# Patient Record
Sex: Male | Born: 1980 | Race: Black or African American | Hispanic: No | Marital: Single | Smoking: Current every day smoker
Health system: Southern US, Community
[De-identification: ages and names within clinical notes are randomized; demographics above are authoritative.]

---

## 2020-01-18 DIAGNOSIS — U071 COVID-19: Secondary | ICD-10-CM

## 2020-01-18 HISTORY — DX: COVID-19: U07.1

## 2020-08-03 ENCOUNTER — Encounter: Payer: Self-pay | Admitting: Emergency Medicine

## 2020-08-03 ENCOUNTER — Emergency Department
Admission: EM | Admit: 2020-08-03 | Discharge: 2020-08-04 | Disposition: A | Discharge status: Other Acute Inpatient Hospital | Payer: Self-pay | Attending: Emergency Medicine | Admitting: Emergency Medicine

## 2020-08-03 ENCOUNTER — Emergency Department: Payer: Self-pay

## 2020-08-03 DIAGNOSIS — Y92481 Parking lot as the place of occurrence of the external cause: Secondary | ICD-10-CM | POA: Insufficient documentation

## 2020-08-03 DIAGNOSIS — S0181XA Laceration without foreign body of other part of head, initial encounter: Secondary | ICD-10-CM | POA: Insufficient documentation

## 2020-08-03 DIAGNOSIS — S199XXA Unspecified injury of neck, initial encounter: Secondary | ICD-10-CM | POA: Insufficient documentation

## 2020-08-03 DIAGNOSIS — E860 Dehydration: Secondary | ICD-10-CM | POA: Insufficient documentation

## 2020-08-03 DIAGNOSIS — Z20822 Contact with and (suspected) exposure to covid-19: Secondary | ICD-10-CM | POA: Insufficient documentation

## 2020-08-03 DIAGNOSIS — Z8616 Personal history of COVID-19: Secondary | ICD-10-CM | POA: Insufficient documentation

## 2020-08-03 DIAGNOSIS — S02609B Fracture of mandible, unspecified, initial encounter for open fracture: Secondary | ICD-10-CM | POA: Insufficient documentation

## 2020-08-03 DIAGNOSIS — Z23 Encounter for immunization: Secondary | ICD-10-CM | POA: Insufficient documentation

## 2020-08-03 DIAGNOSIS — R55 Syncope and collapse: Secondary | ICD-10-CM | POA: Insufficient documentation

## 2020-08-03 DIAGNOSIS — R748 Abnormal levels of other serum enzymes: Secondary | ICD-10-CM | POA: Insufficient documentation

## 2020-08-03 DIAGNOSIS — F17200 Nicotine dependence, unspecified, uncomplicated: Secondary | ICD-10-CM | POA: Insufficient documentation

## 2020-08-03 DIAGNOSIS — N179 Acute kidney failure, unspecified: Secondary | ICD-10-CM | POA: Insufficient documentation

## 2020-08-03 DIAGNOSIS — S1980XA Other specified injuries of unspecified part of neck, initial encounter: Secondary | ICD-10-CM

## 2020-08-03 DIAGNOSIS — W19XXXA Unspecified fall, initial encounter: Secondary | ICD-10-CM | POA: Insufficient documentation

## 2020-08-03 LAB — CBC
HCT: 45.6 % (ref 39.0–52.0)
Hemoglobin: 15.6 g/dL (ref 13.0–17.0)
MCH: 32.2 pg (ref 26.0–34.0)
MCHC: 34.2 g/dL (ref 30.0–36.0)
MCV: 94 fL (ref 80.0–100.0)
Platelets: 275 10*3/uL (ref 150–400)
RBC: 4.85 MIL/uL (ref 4.22–5.81)
RDW: 11.6 % (ref 11.5–15.5)
WBC: 18.9 10*3/uL — ABNORMAL HIGH (ref 4.0–10.5)
nRBC: 0 % (ref 0.0–0.2)

## 2020-08-03 MED ORDER — LACTATED RINGERS IV BOLUS
1000.0000 mL | Freq: Once | INTRAVENOUS | Status: AC
  Administered 2020-08-04: 1000 mL via INTRAVENOUS

## 2020-08-03 MED ORDER — MORPHINE SULFATE (PF) 4 MG/ML IV SOLN
4.0000 mg | Freq: Once | INTRAVENOUS | Status: AC
  Administered 2020-08-04: 4 mg via INTRAVENOUS
  Filled 2020-08-03: qty 1

## 2020-08-03 MED ORDER — TETANUS-DIPHTH-ACELL PERTUSSIS 5-2.5-18.5 LF-MCG/0.5 IM SUSY
0.5000 mL | PREFILLED_SYRINGE | Freq: Once | INTRAMUSCULAR | Status: AC
  Administered 2020-08-04: 0.5 mL via INTRAMUSCULAR
  Filled 2020-08-03: qty 0.5

## 2020-08-03 MED ORDER — IOHEXOL 300 MG/ML  SOLN
75.0000 mL | Freq: Once | INTRAMUSCULAR | Status: AC | PRN
  Administered 2020-08-03: 75 mL via INTRAVENOUS

## 2020-08-03 MED ORDER — ONDANSETRON HCL 4 MG/2ML IJ SOLN
4.0000 mg | INTRAMUSCULAR | Status: AC
  Administered 2020-08-04: 4 mg via INTRAVENOUS
  Filled 2020-08-03: qty 2

## 2020-08-03 NOTE — ED Notes (Signed)
Pts spouse reports they were in car in route to eat when pt started to say, "Im tripping." Spouse reports pt then slumped down into seat and she reports having a hard time getting pt to come to. When at restaurant, pt got out and then fell face forward into the pavement. Spouse able to get pt up afterwards and bring straight to ED.   MD made aware.

## 2020-08-03 NOTE — ED Triage Notes (Signed)
Pt with spouse who reports pt had syncopal episode in parking lot and when she came around the car, pt was face down on pavement. Pt unknown of events. Pt has laceration and blood underneath chin. Blood noted to the nose as well as abrasion above upper lip. Pt not able to speak clearly and continues to state that he is having pain when swallowing. Pts spouse says that alcohol was consumed earlier in the evening. Pt able to answer questions but having difficulty with speaking clearly.

## 2020-08-03 NOTE — ED Provider Notes (Signed)
[facility] Emergency Department Provider Note  ____________________________________________   Event Date/Time   First MD Initiated Contact with Patient 08/03/20 2323     (approximate)  I have reviewed the triage vital signs and the nursing notes.   HISTORY  Chief Complaint Loss of Consciousness  Level 5 caveat: Patient's history is limited by his difficulty speaking due to pain and trismus after trauma.  HPI Robert Tate is a 40 y.o. male with no known chronic medical issues but he rarely goes to the doctor.  He presents along with his wife for evaluation of a syncopal episode with some resulting trauma to his face.  Reportedly he worked all day and had a little to eat or drink.  They were on their way to a restaurant at dinner when he was feeling strange.  He got out of the car and passed out, falling face forward and landing on his face on the pavement.  He returned to normal mental status essentially immediately afterwards and had no seizure-like activity witnessed by his wife.  He has a couple of facial/chin lacerations but he is having difficulty swallowing and speaking, cannot open his mouth more than a couple of centimeters, and reports pain throughout the front and middle of his face.  He denies pain anywhere else.  Specifically denies headache, neck pain, chest pain, shortness of breath, nausea, vomiting, and abdominal pain.  He says that the pain is worse when he tries to open his mouth and swallow and he is gurgling a little bit when he talks.  He has not in respiratory distress.  He says that he had a couple of shots of alcohol earlier in the evening prior to this event and he agreed that he did not have much food or fluids earlier in the day.  He denies drug use.  No new recent medications.  No history of syncopal episodes previously.  No history of heart failure.        Past Medical History:  Diagnosis Date  . COVID-19 01/18/2020   tested  positive on 01/18/2020    There are no problems to display for this patient.   History reviewed. No pertinent surgical history.  Prior to Admission medications   Not on File    Allergies Patient has no known allergies.  History reviewed. No pertinent family history.  Social History Social History   Tobacco Use  . Smoking status: Current Every Day Smoker  . Smokeless tobacco: Never Used  Substance Use Topics  . Alcohol use: Yes    Review of Systems Constitutional: No fever/chills Eyes: No visual changes. ENT: Facial trauma, trismus, difficulty swallowing. Cardiovascular: Positive for syncope and collapse.  Denies chest pain. Respiratory: Denies shortness of breath. Gastrointestinal: No abdominal pain.  No nausea, no vomiting.  No diarrhea.  No constipation. Genitourinary: Negative for dysuria. Musculoskeletal: Negative for neck pain.  Negative for back pain. Integumentary: Several facial lacerations. Neurological: Negative for headaches, focal weakness or numbness.   ____________________________________________   PHYSICAL EXAM:  VITAL SIGNS: ED Triage Vitals  Enc Vitals Group     BP 08/03/20 2312 127/74     Pulse Rate 08/03/20 2312 74     Resp 08/03/20 2312 18     Temp 08/03/20 2312 97.6 F (36.4 C)     Temp Source 08/03/20 2312 Axillary     SpO2 08/03/20 2312 100 %     Weight 08/03/20 2333 92.5 kg (204 lb)     Height 08/03/20  2333 1.829 m (6')     Head Circumference --      Peak Flow --      Pain Score --      Pain Loc --      Pain Edu? --      Excl. in GC? --     Constitutional: Alert and oriented.  Eyes: Conjunctivae are normal.  Head: Patient is holding his mouth a couple of centimeters open and the jaw seems slightly displaced to the left.  He cannot open his mouth well.  There is blood inside his mouth but no obvious active bleeding.  Tenderness to palpation of the bridge of the nose and slightly along the left side the jaw but no point  tenderness. Nose: Dried epistaxis, no active bleeding. Mouth/Throat: Patient is wearing a mask. Neck: No stridor.  No meningeal signs.  No expanding neck hematoma that is visible, no induration or tenderness in any 1 particular location along his neck or below his mandible. Cardiovascular: Normal rate, regular rhythm. Good peripheral circulation. Respiratory: Normal respiratory effort.  No retractions. Gastrointestinal: muscular body habitus.  Soft and nontender. No distention.  Musculoskeletal: No lower extremity tenderness nor edema. No gross deformities of extremities. Neurologic:  Normal speech and language. No gross focal neurologic deficits are appreciated.  Good strength throughout major muscle groups.  No focal weakness. Skin:  Skin is warm, dry and intact.  Deep jagged laceration proximately 2 cm wide at the middle of his chin, bleeding well controlled.  Irrigated by Engineer, civil (consulting). Psychiatric: Mood and affect are normal. Speech and behavior are normal.  ____________________________________________   LABS (all labs ordered are listed, but only abnormal results are displayed)  Labs Reviewed  BASIC METABOLIC PANEL - Abnormal; Notable for the following components:      Result Value   CO2 21 (*)    Glucose, Bld 102 (*)    Creatinine, Ser 1.45 (*)    All other components within normal limits  CBC - Abnormal; Notable for the following components:   WBC 18.9 (*)    All other components within normal limits  ETHANOL - Abnormal; Notable for the following components:   Alcohol, Ethyl (B) 56 (*)    All other components within normal limits  CK - Abnormal; Notable for the following components:   Total CK 824 (*)    All other components within normal limits  RESP PANEL BY RT-PCR (FLU A&B, COVID) ARPGX2  URINALYSIS, COMPLETE (UACMP) WITH MICROSCOPIC  URINE DRUG SCREEN, QUALITATIVE ([facility] ONLY)  TROPONIN I (HIGH SENSITIVITY)   ____________________________________________  EKG  ED ECG  REPORT I, Loleta Rose, the attending physician, personally viewed and interpreted this ECG.  Date: 08/02/2020 EKG Time: 23: 16 Rate: 70 Rhythm: normal sinus rhythm QRS Axis: normal Intervals: normal ST/T Wave abnormalities: normal Narrative Interpretation: no evidence of acute ischemia  ____________________________________________  RADIOLOGY I, Loleta Rose, personally viewed and evaluated these images (CT scans) as part of my medical decision making, as well as reviewing the written report by the radiologist.  ED MD interpretation: Extensive open mandibular fractures.  No acute abnormality noted in the soft tissue of the neck nor of the head and cervical spine.  Official radiology report(s): CT Head Wo Contrast  Result Date: 08/04/2020 CLINICAL DATA:  Initial evaluation for acute syncope, found down. EXAM: CT HEAD WITHOUT CONTRAST TECHNIQUE: Contiguous axial images were obtained from the base of the skull through the vertex without intravenous contrast. COMPARISON:  None. FINDINGS: Brain: Generalized age-related cerebral  atrophy. No acute intracranial hemorrhage. No acute large vessel territory infarct. No mass lesion, midline shift or mass effect. No hydrocephalus or extra-axial fluid collection. Vascular: No hyperdense vessel. Skull: Scalp soft tissues within normal limits.  Calvarium intact. Sinuses/Orbits: Globes and orbital soft tissues demonstrate no acute finding. Acute mandibular fractures partially visualize, better evaluated on concomitant maxillofacial CT. Visualized paranasal sinuses are clear. No mastoid effusion. Other: None. IMPRESSION: 1. No acute intracranial abnormality. 2. Acute mandibular fractures, better evaluated on concomitant maxillofacial CT. Electronically Signed   By: Rise Mu M.D.   On: 08/04/2020 01:10   CT Soft Tissue Neck W Contrast  Result Date: 08/04/2020 CLINICAL DATA:  Initial evaluation for soft tissue mass, spontaneous hemorrhage, syncopal  episode. EXAM: CT NECK WITH CONTRAST TECHNIQUE: Multidetector CT imaging of the neck was performed using the standard protocol following the bolus administration of intravenous contrast. CONTRAST:  73mL OMNIPAQUE IOHEXOL 300 MG/ML  SOLN COMPARISON:  Concomitant CTs of the face and cervical spine. FINDINGS: Pharynx and larynx: Oral cavity within normal limits without discrete mass or collection. Few scattered foci of soft tissue emphysema present within the left sublingual space related to the acute mandibular fractures. No frank soft tissue hematoma. No base of tongue lesion. Oropharynx and nasopharynx within normal limits. No retropharyngeal collection or swelling. Epiglottis normal. Vallecula clear. Remainder of the hypopharynx and supraglottic larynx within normal limits. True cords symmetric and normal. Subglottic airway patent and clear. Salivary glands: Salivary glands including the parotid and submandibular glands are normal. Thyroid: Normal. Lymph nodes: No enlarged or pathologic adenopathy within the neck. Vascular: Normal intravascular enhancement seen throughout the neck. Limited intracranial: Unremarkable. Visualized orbits: Unremarkable. Mastoids and visualized paranasal sinuses: Right maxillary sinus retention cyst present. Visualized paranasal sinuses are otherwise largely clear. Visualize mastoids and middle ear cavities are well pneumatized and free of fluid. Skeleton: Acute fractures of the mandible, better evaluated on concomitant maxillofacial CT. No other definite osseous abnormality, although cervical spine better evaluated on concomitant CT of the cervical spine. No discrete or worrisome osseous lesions. Upper chest: Visualized upper chest demonstrates no acute finding. Partially visualized lungs are grossly clear. Other: Soft tissue laceration overlies the acute left parasymphyseal mandibular fracture at the chin. Punctate radiopaque density at this level likely reflects a small retained  foreign body and/or debris (series 3, image 66). IMPRESSION: 1. Acute fractures of the mandible, better evaluated on concomitant maxillofacial CT. Overlying soft tissue laceration at the chin. Superimposed punctate radiopaque density at this level likely reflects a small retained foreign body and/or debris. No frank soft tissue hematoma. 2. No other acute abnormality within the neck. Electronically Signed   By: Rise Mu M.D.   On: 08/04/2020 00:31   CT C-SPINE NO CHARGE  Result Date: 08/04/2020 CLINICAL DATA:  Initial evaluation for acute trauma, syncope, found down. EXAM: CT CERVICAL SPINE WITHOUT CONTRAST TECHNIQUE: Multidetector CT imaging of the cervical spine was performed without intravenous contrast. Multiplanar CT image reconstructions were also generated. COMPARISON:  None. FINDINGS: Alignment: Straightening of the normal cervical lordosis. No listhesis or malalignment. Skull base and vertebrae: Skull base intact. Normal C1-2 articulations are preserved in the dens is intact. Vertebral body heights maintained. No acute fracture. Symmetric lucencies extending through the costochondral aspects of the first ribs without associated soft tissue swelling noted, favored to be chronic in nature (series 2, image 100). Soft tissues and spinal canal: Soft tissues of the neck demonstrate no acute finding. No abnormal prevertebral edema. Spinal canal within normal limits.  Acute mandibular fractures with overlying soft tissue laceration noted, better evaluated on maxillofacial CT. Disc levels: Mild cervical spondylosis noted at C5-6 and C6-7 without significant stenosis. Upper chest: Visualized upper chest demonstrates no acute finding. Partially visualized lung apices are clear. Other: None. IMPRESSION: 1. No acute traumatic injury within the cervical spine. 2. Acute mandibular fractures with overlying soft tissue laceration, better evaluated on maxillofacial CT. 3. Symmetric lucencies extending through  the costochondral aspects of the first ribs without associated soft tissue swelling, favored to be chronic in nature. Correlation with physical exam recommended. Electronically Signed   By: Rise Mu M.D.   On: 08/04/2020 01:02   CT Maxillofacial Wo Contrast  Result Date: 08/04/2020 CLINICAL DATA:  Initial evaluation for acute facial trauma, found down. EXAM: CT MAXILLOFACIAL WITHOUT CONTRAST TECHNIQUE: Multidetector CT imaging of the maxillofacial structures was performed. Multiplanar CT image reconstructions were also generated. COMPARISON:  None. FINDINGS: Osseous: Zygomatic arches intact. No acute maxillary fracture. Pterygoid plates intact. Deformity seen about the anterior nasal bones bilaterally, favored to be chronic, although an acute component is difficult to exclude. Right-to-left nasal septal deviation without visible fracture. There are acute nondisplaced fractures extending through the subcondylar bilateral mandibular rami, angulated on the right. Additional acute minimally displaced fracture extends through the parasymphyseal left mandibular body. Multiple left-sided teeth appear fractured, at least involving the left mandibular first bicuspid, second bicuspid, as well as the first and second molars (series 4, images 77, 75, 73, 70). The parasymphyseal fracture line extends through the root of the left mandibular central incisor. The right mandibular condyle is dislocated anteriorly relative to the temporomandibular joint (series 10, image 26). Orbits: Globes and orbital soft tissues within normal limits. Bony orbits intact. Sinuses: Mucosal thickening seen at the floor of the right maxillary sinus. Paranasal sinuses are otherwise clear. No hemosinus. Soft tissues: Soft tissue laceration present at the left chin overlying the parasymphyseal mandibular fracture. Small superimposed radiopaque retained foreign body and/or debris noted (series 3, image 93). Limited intracranial: Negative.  IMPRESSION: 1. Acute nondisplaced fractures extending through the subcondylar bilateral mandibular rami, angulated on the right, with additional acute mildly minimally fracture through the parasymphyseal left mandibular body. The right mandibular condyle is dislocated from the right TMJ. 2. Fracturing of multiple left-sided teeth, involving the left mandibular first and second bicuspid as well as the left first and second molars. 3. Deformity about the anterior nasal bones bilaterally, favored to be chronic, although correlation with physical exam for possible acute component recommended. 4. Soft tissue laceration at the left chin overlying the parasymphyseal mandibular fracture. Small superimposed radiopaque retained foreign body and/or debris as above. Electronically Signed   By: Rise Mu M.D.   On: 08/04/2020 00:41    ____________________________________________   PROCEDURES   Procedure(s) performed (including Critical Care):  .1-3 Lead EKG Interpretation Performed by: Loleta Rose, MD Authorized by: Loleta Rose, MD     Interpretation: normal     ECG rate:  68   ECG rate assessment: normal     Rhythm: sinus rhythm     Ectopy: none     Conduction: normal       ____________________________________________   INITIAL IMPRESSION / MDM / ASSESSMENT AND PLAN / ED COURSE  As part of my medical decision making, I reviewed the following data within the electronic MEDICAL RECORD NUMBER Nursing notes reviewed and incorporated, discussed with patient and family member, Labs reviewed , EKG interpreted , Old chart reviewed, Discussed with accepting physician (Dr. Leotis Shames  [place] in [facility] ED), Discussed with OMFS (Dr. Reuel Boom at [facility]) and reviewed Notes from prior ED visits   Differential diagnosis includes, but is not limited to, orthostatic hypotension, vasovagal episode, intoxication or medication/drug side effect, cardiac arrhythmia, seizure, CVA, less likely acute infection.  The  patient is on the cardiac monitor to evaluate for evidence of arrhythmia and/or significant heart rate changes.  Patient is neurologically intact, no altered mental status, GCS 15.  I suspect orthostatic hypotension in the setting of volume depletion from working hard today not eating or drinking very much, likely complicated by alcohol consumption earlier this evening.  I am most concerned about the patient's facial and/or neck trauma.  His physical exam suggest mandibular fracture or dislocation even though he does not have point tenderness.  Additionally he is reporting difficulty and pain with swallowing.  He has no obvious expanding neck hematoma and his airway is not compromised at this time but I am concerned about the possibility of a hematoma, bleed, fracture, etc.  I will proceed without labs for CT head without contrast given his syncopal episode and trauma, CT maxillofacial without contrast for the facial trauma, and CT soft tissue neck with IV contrast for trauma as well as the possibility of soft tissue swelling or bleeding, also with cervical spine reconstructions.  Lab work is pending which includes basic labs as well as a CK to look for evidence of rhabdomyolysis and an ethanol level to determine the degree of intoxication had a urine drug screen to try to identify other reasons for his syncopal episode.  I ordered morphine 4 mg IV and Zofran 4 mg IV in addition to 1 L lactated Ringer's for his probable volume depletion.  Also ordered a Tdap since the patient does not know when his last booster shot was and it was definitely more than 5 years according to the wife.     Clinical Course as of 08/04/20 0257  Sat Aug 03, 2020  2339 Informed CT technologist Minerva Areola that I wish to proceed emergently with scans without labs.  He acknowledged the plan [CF]  2356 WBC(!): 18.9 Nonspecific leukocytosis, likely reactive in the setting of his acute episode given no infectious signs or symptoms. [CF]   2358 Basic metabolic panel(!) Creatinine of 1.45 may indicate viral depletion and acute kidney injury but could be a result of his muscular body habitus.  Regardless he is getting IV fluids. [CF]  Sun Aug 04, 2020  0008 Alcohol, Ethyl (B)(!): 56 [CF]  0016 Basic metabolic panel(!) [CF]  0017 I personally reviewed the CT scans and it appears the patient has at least 3 separate mandibular fractures, 2 of which are significantly displaced.  I will treat with empiric antibiotics (Unasyn 3 g IV for broad-spectrum coverage including oral flora) while awaiting the reports from the radiologist, but given the blood in the patient's mouth and the extent of his mandibular fractures I believe these should be treated as open fractures. [CF]  0042 CK Total(!): 824 Elevated CK, will we will continue with IV fluids. [CF]  1856 CT Maxillofacial Wo Contrast Radiologist confirmed extensive mandibular and dental injuries including angulation and dislocation of the right mandibular condyle from the TMJ.  I updated the patient and his wife.  Patient is hemodynamically stable, getting IV fluids, some pain improvement but not substantial after the initial dose of morphine and I will provide an additional dose of morphine for milligrams IV, I am somewhat reluctant to over sedate with Dilaudid, for  example, because the patient is having to suction himself given his difficulty moving his jaw and swallowing, and I do not want to cause an airway or respiratory issue.  I talked with the patient and wife about the need to transfer to a center with the ability to handle facial trauma and their preference is for [facility].  I have asked my secretary Melody to contact the [facility] logistics center.  [CF]  0110 Troponin I (High Sensitivity): 3 High-sensitivity troponin is negative and the patient has not had any chest pain.  I am canceling the repeat. [CF]  0110 I spoke by phone with the [facility] logistics center and explained the clinical  situation.  We are awaiting callback from the facial trauma representative. [CF]  0117 CT Head Wo Contrast Radiology reports confirm my suspicion that there is no evidence of intracranial injury or bleeding and no cervical spine injury, nor any soft tissue neck injury.  The injury seem to be isolated to the patient's mandible. [CF]  4967 Spoke by phone with Dr. Reuel Boom with OMFS.  She agrees that the patient needs to be seen at [facility] and asks that we discuss with the ED for transfer.  Awaiting callback from EDP at [facility]. [CF]  0212 Discussed case by phone with Dr. Leatrice Jewels in the [facility] ED.  She accepted the patient as an ED to ED transfer.  Patient hemodynamically stable. [CF]  0231 SARS Coronavirus 2 by RT PCR: NEGATIVE [CF]    Clinical Course User Index [CF] Loleta Rose, MD     ____________________________________________  FINAL CLINICAL IMPRESSION(S) / ED DIAGNOSES  Final diagnoses:  Blunt trauma of neck  Syncope and collapse  Open fracture of mandible, unspecified laterality, unspecified mandibular site, initial encounter (HCC)  AKI (acute kidney injury) (HCC)  Dehydration  Elevated CK     MEDICATIONS GIVEN DURING THIS VISIT:  Medications  iohexol (OMNIPAQUE) 300 MG/ML solution 75 mL (75 mLs Intravenous Contrast Given 08/03/20 2348)  lactated ringers bolus 1,000 mL (0 mLs Intravenous Stopped 08/04/20 0231)  morphine 4 MG/ML injection 4 mg (4 mg Intravenous Given 08/04/20 0018)  ondansetron (ZOFRAN) injection 4 mg (4 mg Intravenous Given 08/04/20 0017)  Tdap (BOOSTRIX) injection 0.5 mL (0.5 mLs Intramuscular Given 08/04/20 0019)  Ampicillin-Sulbactam (UNASYN) 3 g in sodium chloride 0.9 % 100 mL IVPB (0 g Intravenous Stopped 08/04/20 0119)  lactated ringers bolus 1,000 mL (1,000 mLs Intravenous Transfusing/Transfer 08/04/20 0245)  morphine 4 MG/ML injection 4 mg (4 mg Intravenous Given 08/04/20 0135)     ED Discharge Orders    None      *Please note:  Dayvin Aber was evaluated in  Emergency Department on 08/04/2020 for the symptoms described in the history of present illness. He was evaluated in the context of the global COVID-19 pandemic, which necessitated consideration that the patient might be at risk for infection with the SARS-CoV-2 virus that causes COVID-19. Institutional protocols and algorithms that pertain to the evaluation of patients at risk for COVID-19 are in a state of rapid change based on information released by regulatory bodies including the CDC and federal and state organizations. These policies and algorithms were followed during the patient's care in the ED.  Some ED evaluations and interventions may be delayed as a result of limited staffing during and after the pandemic.*  Note:  This document was prepared using Dragon voice recognition software and may include unintentional dictation errors.   Loleta Rose, MD 08/04/20 [phone]

## 2020-08-04 LAB — CK: Total CK: 824 U/L — ABNORMAL HIGH (ref 49–397)

## 2020-08-04 LAB — BASIC METABOLIC PANEL
Anion gap: 13 (ref 5–15)
BUN: 14 mg/dL (ref 6–20)
CO2: 21 mmol/L — ABNORMAL LOW (ref 22–32)
Calcium: 9.2 mg/dL (ref 8.9–10.3)
Chloride: 105 mmol/L (ref 98–111)
Creatinine, Ser: 1.45 mg/dL — ABNORMAL HIGH (ref 0.61–1.24)
GFR, Estimated: >60 mL/min (ref >60)
Glucose, Bld: 102 mg/dL — ABNORMAL HIGH (ref 70–99)
Potassium: 4.1 mmol/L (ref 3.5–5.1)
Sodium: 139 mmol/L (ref 135–145)

## 2020-08-04 LAB — RESP PANEL BY RT-PCR (FLU A&B, COVID) ARPGX2
Influenza A by PCR: NEGATIVE
Influenza B by PCR: NEGATIVE
SARS Coronavirus 2 by RT PCR: NEGATIVE

## 2020-08-04 LAB — TROPONIN I (HIGH SENSITIVITY): Troponin I (High Sensitivity): 3 ng/L (ref <18)

## 2020-08-04 LAB — ETHANOL: Alcohol, Ethyl (B): 56 mg/dL — ABNORMAL HIGH (ref <10)

## 2020-08-04 MED ORDER — MORPHINE SULFATE (PF) 4 MG/ML IV SOLN
4.0000 mg | Freq: Once | INTRAVENOUS | Status: AC
Start: 2020-08-04 — End: 2020-08-04
  Administered 2020-08-04: 4 mg via INTRAVENOUS
  Filled 2020-08-04: qty 1

## 2020-08-04 MED ORDER — LACTATED RINGERS IV BOLUS
1000.0000 mL | Freq: Once | INTRAVENOUS | Status: AC
  Administered 2020-08-04: 1000 mL via INTRAVENOUS

## 2020-08-04 MED ORDER — SODIUM CHLORIDE 0.9 % IV SOLN
3.0000 g | INTRAVENOUS | Status: AC
  Administered 2020-08-04: 3 g via INTRAVENOUS
  Filled 2020-08-04: qty 8

## 2020-08-04 NOTE — ED Notes (Signed)
Tom to [employer] to [facility]. FaceSheet faxed.

## 2020-08-04 NOTE — ED Notes (Signed)
ED Provider at bedside. 

## 2020-08-04 NOTE — ED Notes (Signed)
Chart checked for completion 

## 2022-09-12 IMAGING — CT CT NECK W/ CM
3 of 4 series · 12 of 33 positions shown, 14 images · IV contrast (omnipaque)
Comparison: Concomitant CTs of the face and cervical spine.

CLINICAL DATA: Initial evaluation for soft tissue mass, spontaneous
hemorrhage, syncopal episode.

EXAM:
CT NECK WITH CONTRAST
TECHNIQUE: Multidetector CT imaging of the neck was performed using the
standard protocol following the bolus administration of intravenous
contrast.
CONTRAST:  75mL OMNIPAQUE IOHEXOL 300 MG/ML  SOLN

[Series 6: sag neck · sagittal · 0.52mm/px · 5 of 105 slices shown, 6 images]
[im 35/105  bone]
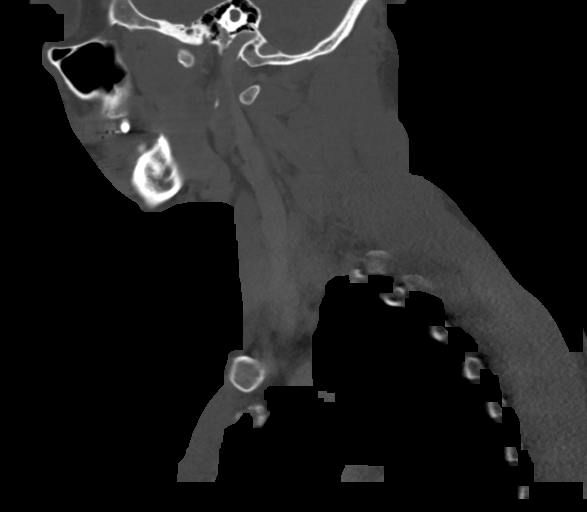
[im 44/105  bone]
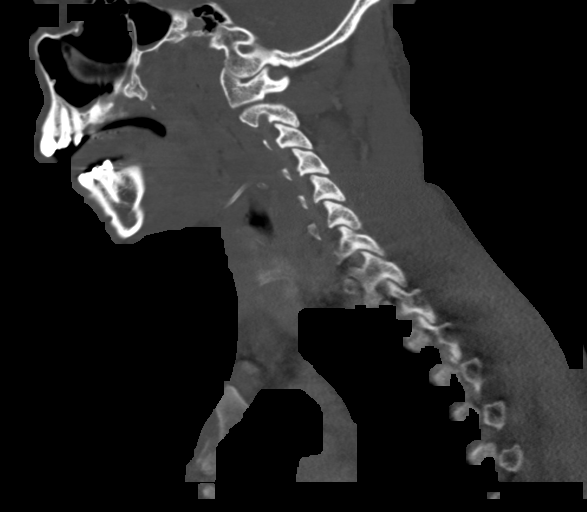
[im 53/105  soft-tissue]
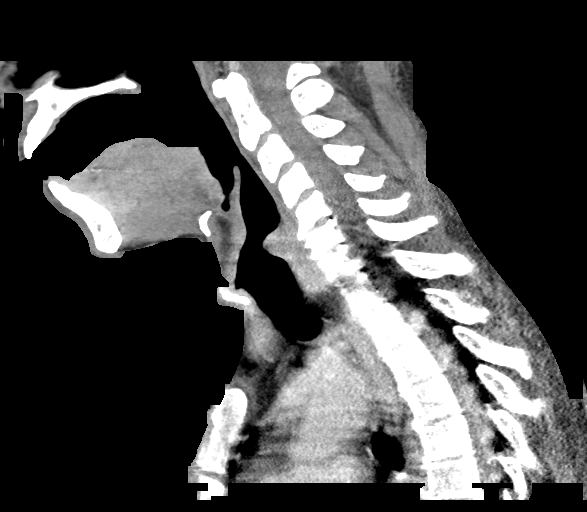
[im 53/105  bone]
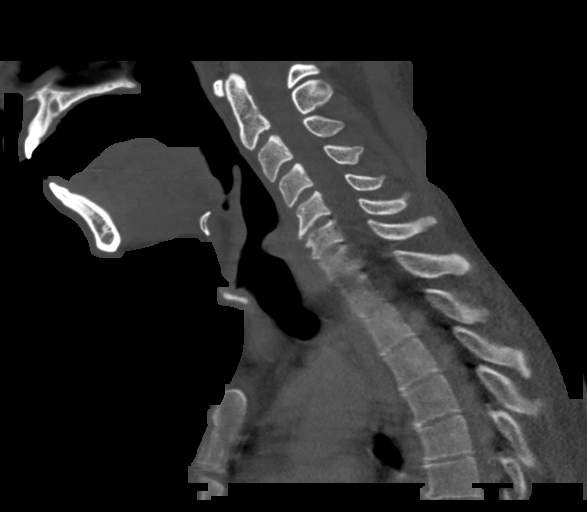
[im 61/105  bone]
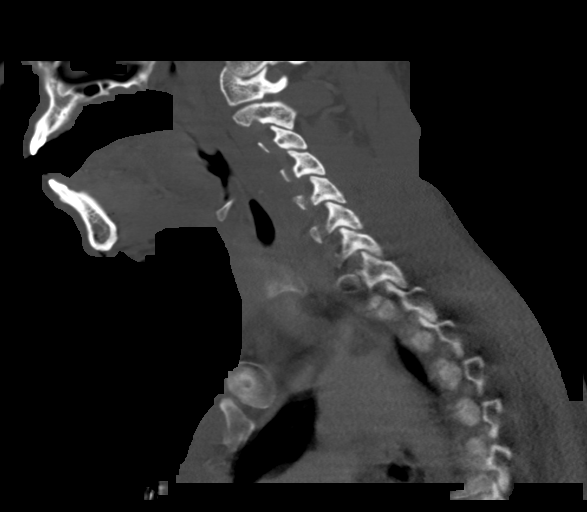
[im 70/105  bone]
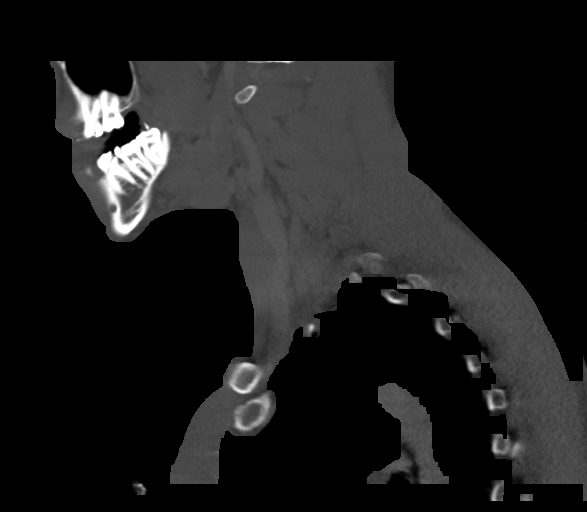

[Series 7: cor neck · coronal · 0.42mm/px · 3 of 149 slices shown]
[im 30/149  bone]
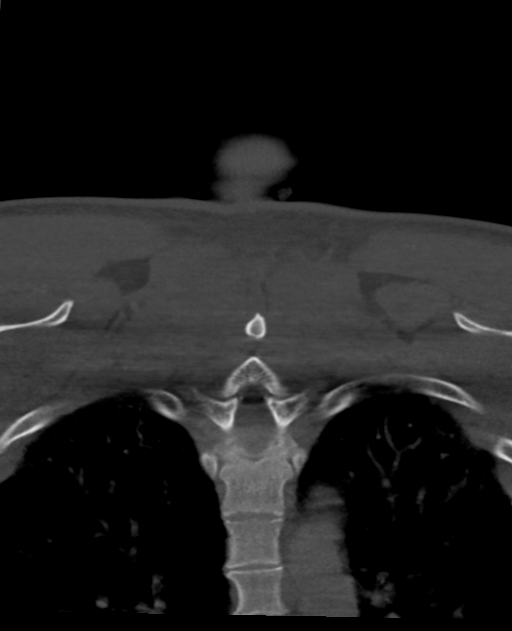
[im 60/149  bone]
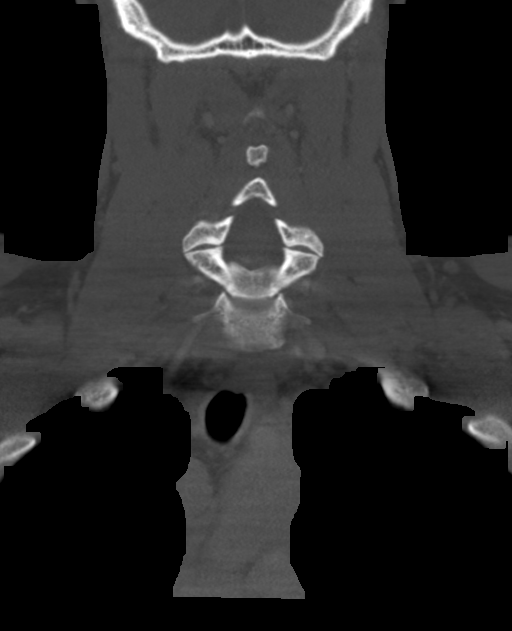
[im 89/149  bone]
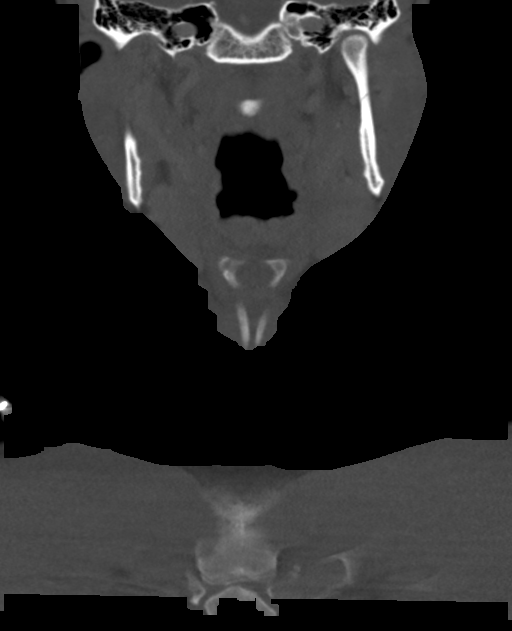

[Series 8: orthogonal ax · axial · 0.39mm/px · z∈[-339,-163]mm · 4 of 132 slices shown, 5 images]
[im 22/132  soft-tissue]
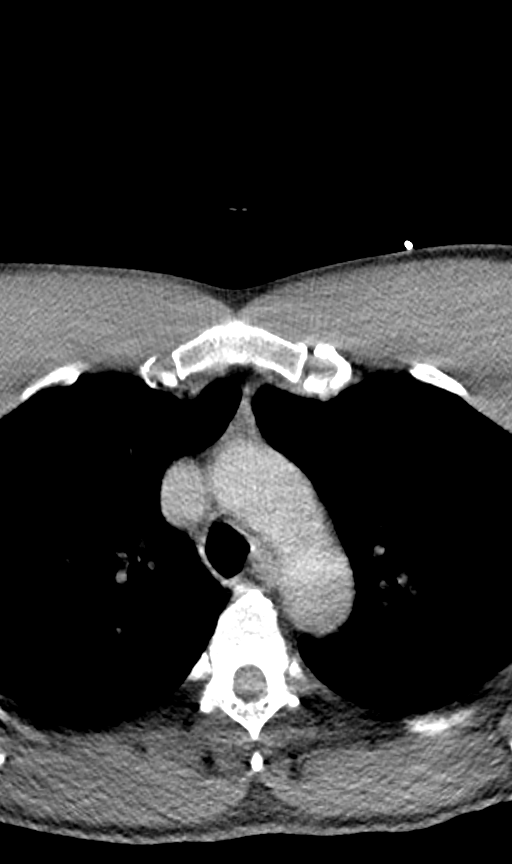
[im 22/132  bone]
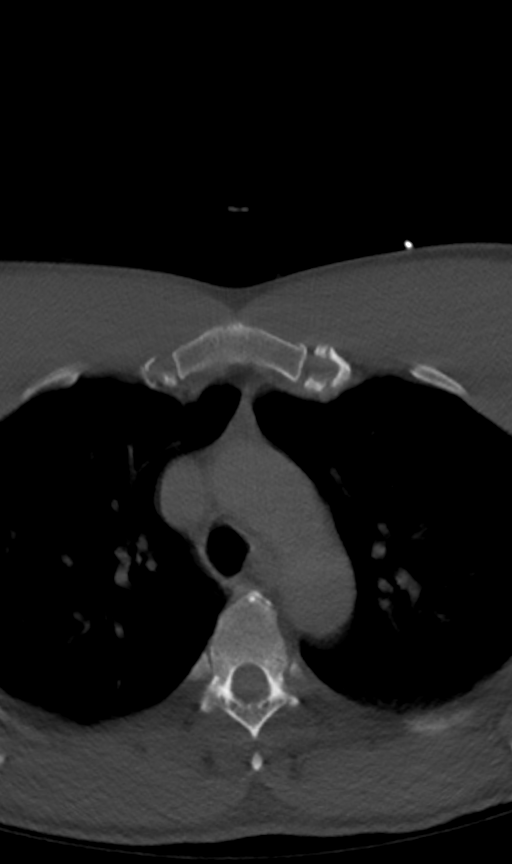
[im 44/132  bone]
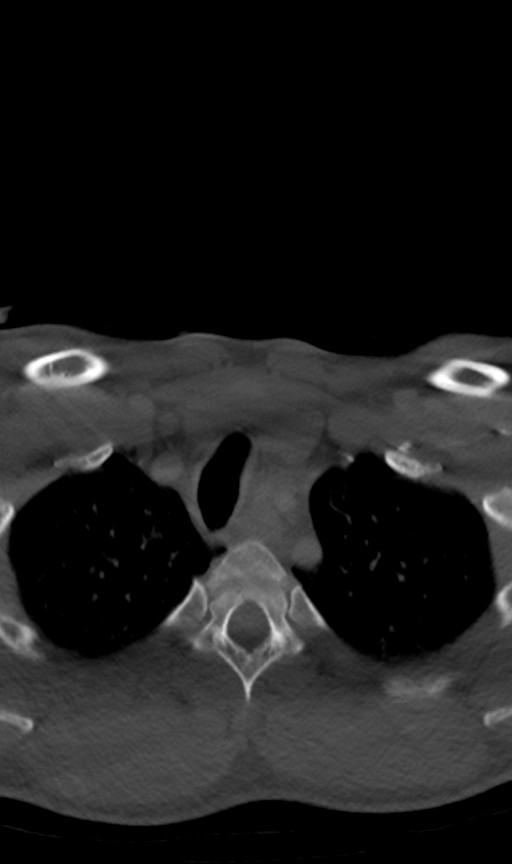
[im 88/132  bone]
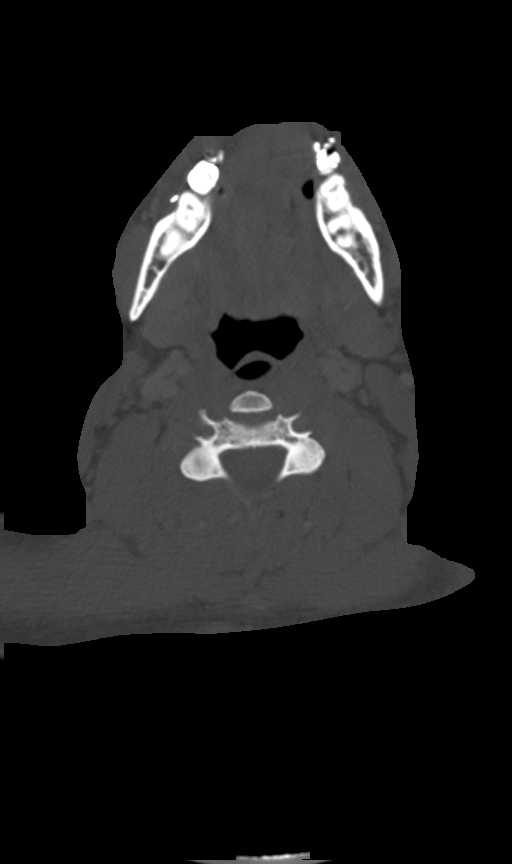
[im 110/132  bone]
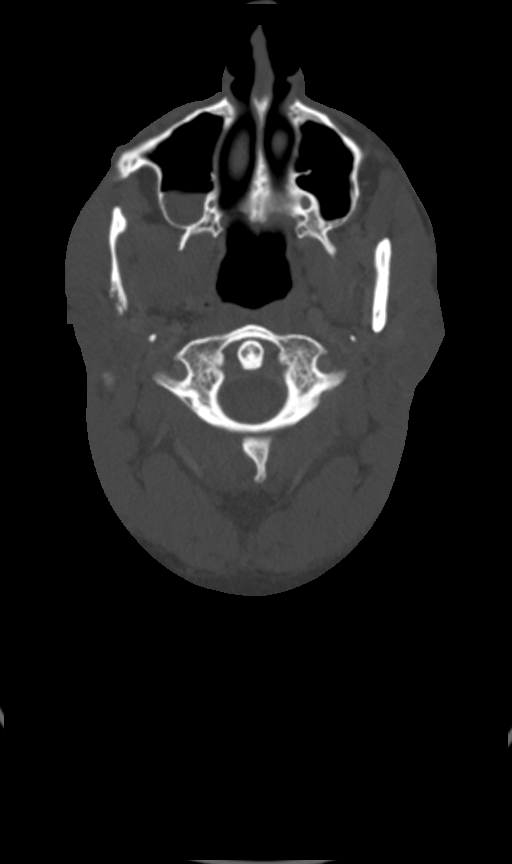

[12 of 33 positions shown; findings below may reference images not displayed]

FINDINGS: Pharynx and larynx: Oral cavity within normal limits without
discrete mass or collection. Few scattered foci of soft tissue
emphysema present within the left sublingual space related to the
acute mandibular fractures. No frank soft tissue hematoma. No base
of tongue lesion. Oropharynx and nasopharynx within normal limits.
No retropharyngeal collection or swelling. Epiglottis normal.
Vallecula clear. Remainder of the hypopharynx and supraglottic
larynx within normal limits. True cords symmetric and normal.
Subglottic airway patent and clear.

Salivary glands: Salivary glands including the parotid and
submandibular glands are normal.

Thyroid: Normal.

Lymph nodes: No enlarged or pathologic adenopathy within the neck.

Vascular: Normal intravascular enhancement seen throughout the neck.

Limited intracranial: Unremarkable.

Visualized orbits: Unremarkable.

Mastoids and visualized paranasal sinuses: Right maxillary sinus
retention cyst present. Visualized paranasal sinuses are otherwise
largely clear. Visualize mastoids and middle ear cavities are well
pneumatized and free of fluid.

Skeleton: Acute fractures of the mandible, better evaluated on
concomitant maxillofacial CT. No other definite osseous abnormality,
although cervical spine better evaluated on concomitant CT of the
cervical spine. No discrete or worrisome osseous lesions.

Upper chest: Visualized upper chest demonstrates no acute finding.
Partially visualized lungs are grossly clear.

Other: Soft tissue laceration overlies the acute left parasymphyseal
mandibular fracture at the chin. Punctate radiopaque density at this
level likely reflects a small retained foreign body and/or debris
(series 3, image 66).
IMPRESSION: 1. Acute fractures of the mandible, better evaluated on concomitant
maxillofacial CT. Overlying soft tissue laceration at the chin.
Superimposed punctate radiopaque density at this level likely
reflects a small retained foreign body and/or debris. No frank soft
tissue hematoma.
2. No other acute abnormality within the neck.

## 2022-09-12 IMAGING — CT CT HEAD W/O CM
3 series · 16 of 47 positions shown, 19 images · non-contrast
Comparison: None.

CLINICAL DATA: Initial evaluation for acute syncope, found down.

EXAM:
CT HEAD WITHOUT CONTRAST
TECHNIQUE: Contiguous axial images were obtained from the base of the skull
through the vertex without intravenous contrast.

[Series 3: head wo · axial · 0.48mm/px · z∈[-134,+11]mm · 10 of 35 slices shown, 13 images]
[im 3/35  brain]
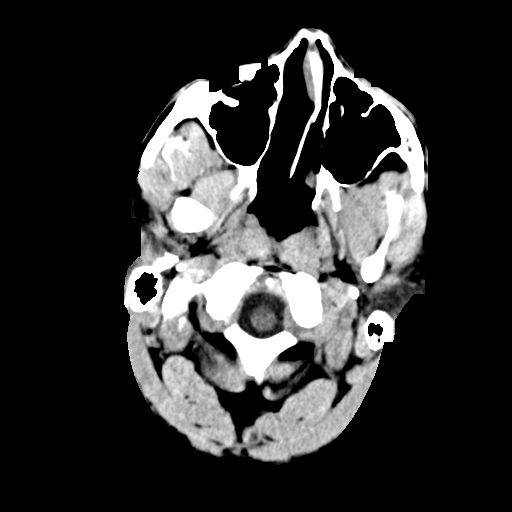
[im 3/35  bone]
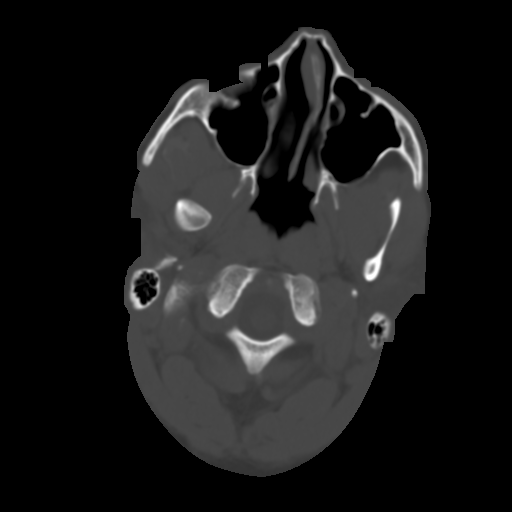
[im 6/35  brain]
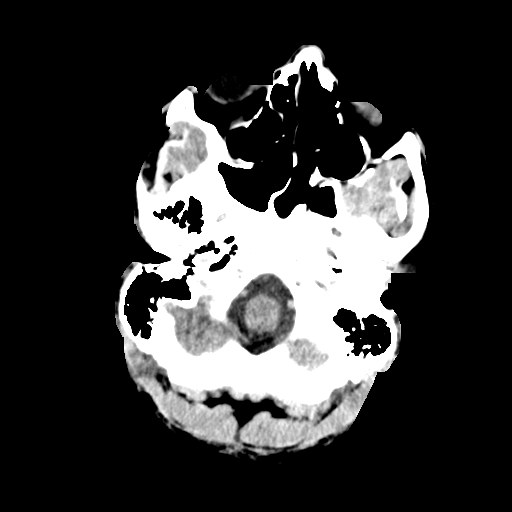
[im 10/35  brain]
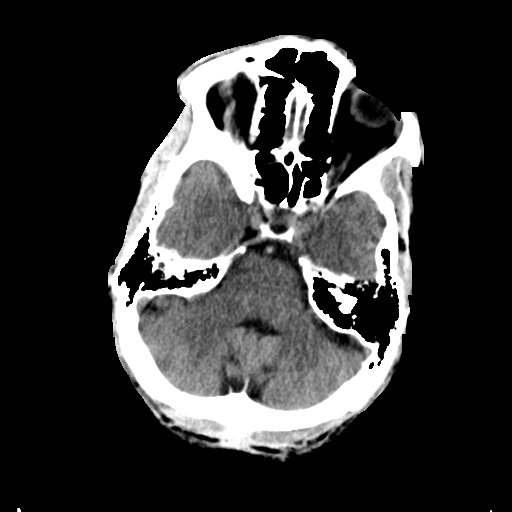
[im 12/35  brain]
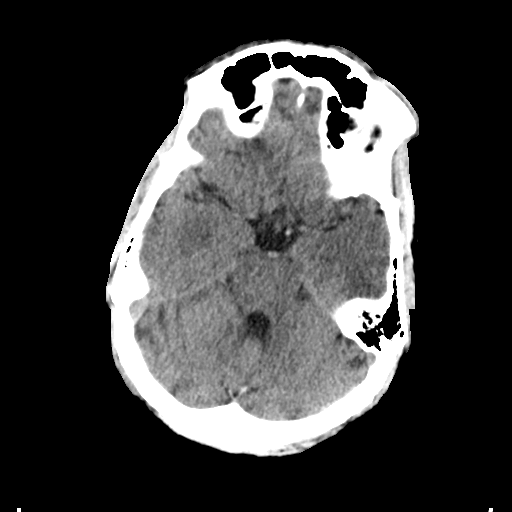
[im 16/35  brain]
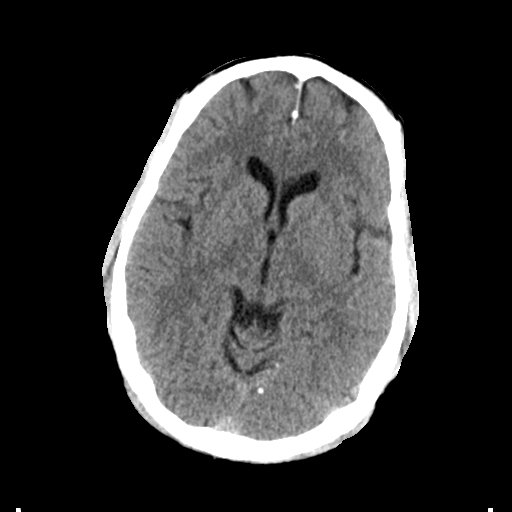
[im 16/35  bone]
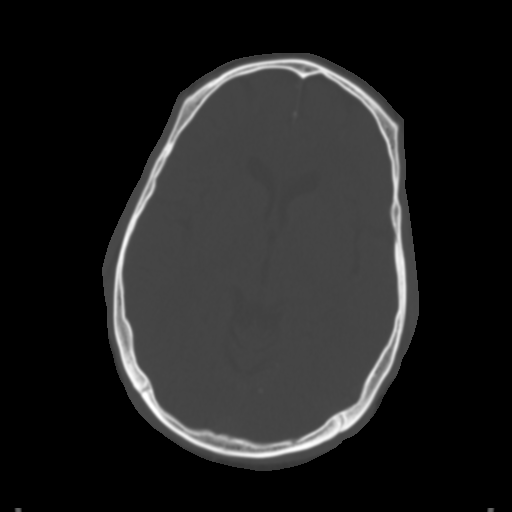
[im 19/35  brain]
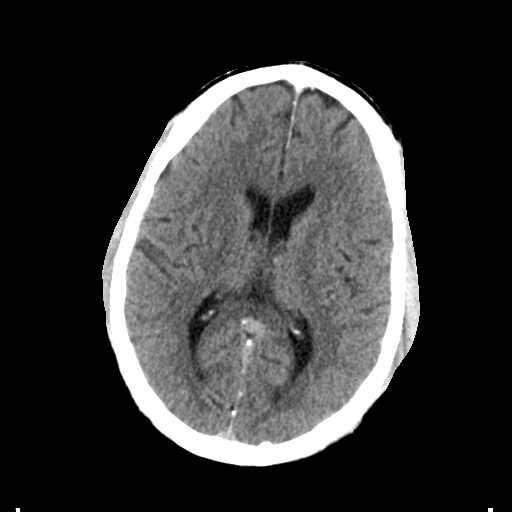
[im 23/35  brain]
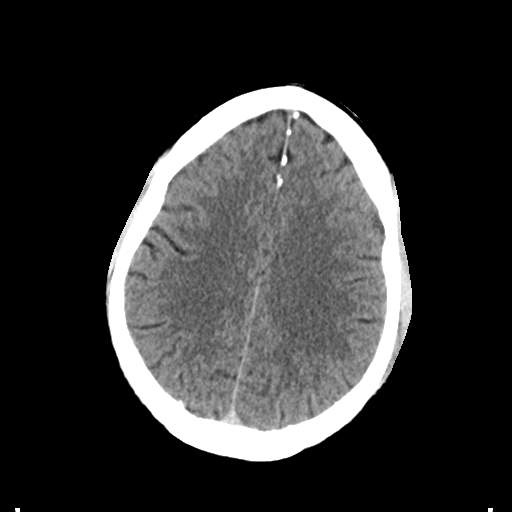
[im 26/35  brain]
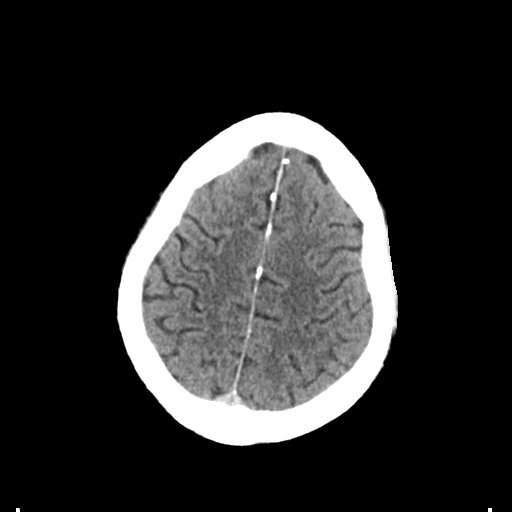
[im 29/35  brain]
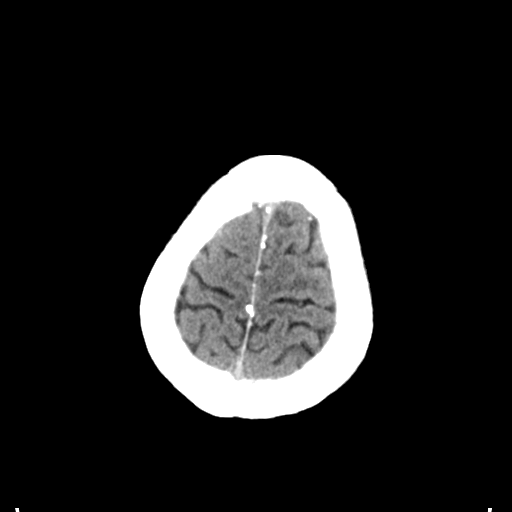
[im 29/35  bone]
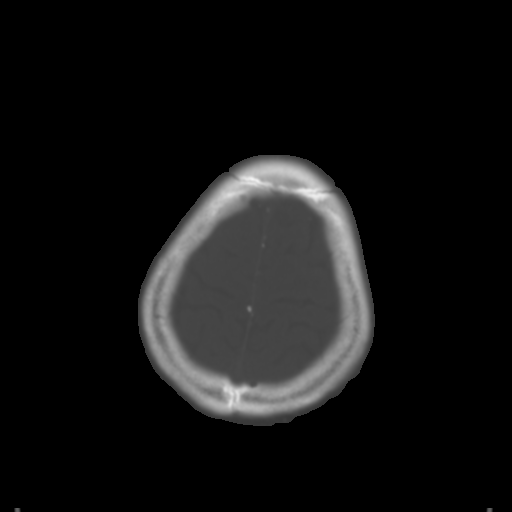
[im 32/35  brain]
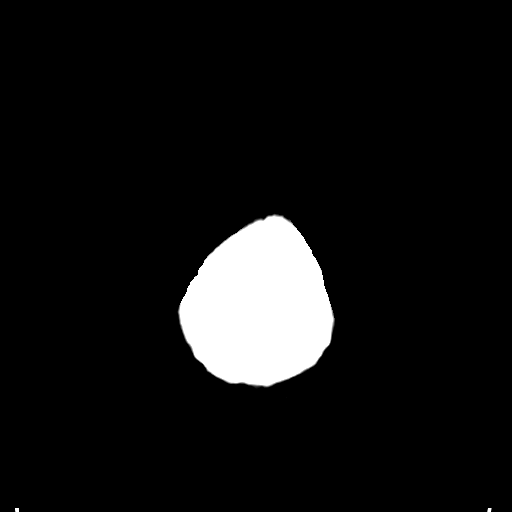

[Series 5: coronal soft tissue · coronal · 0.36mm/px · 3 of 77 slices shown]
[im 26/77  brain]
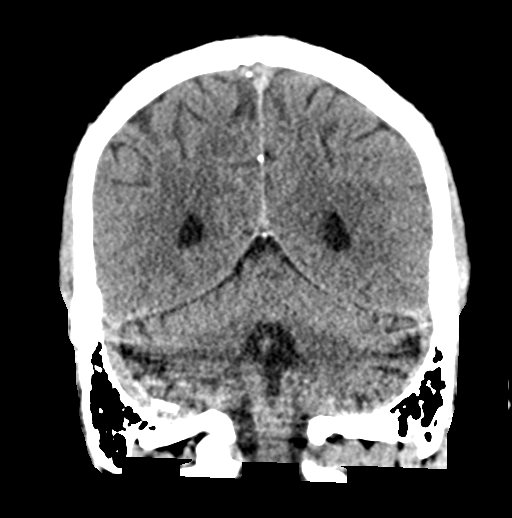
[im 34/77  brain]
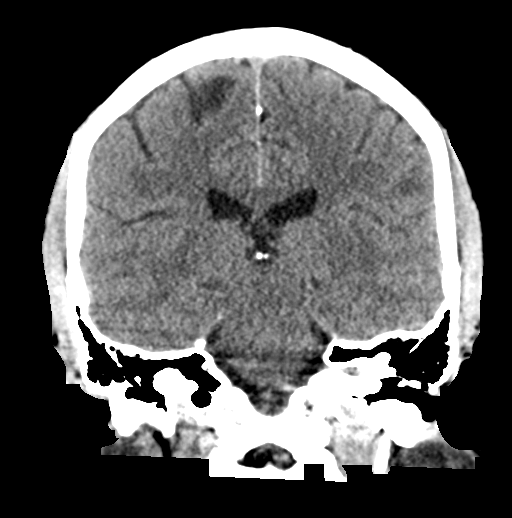
[im 43/77  brain]
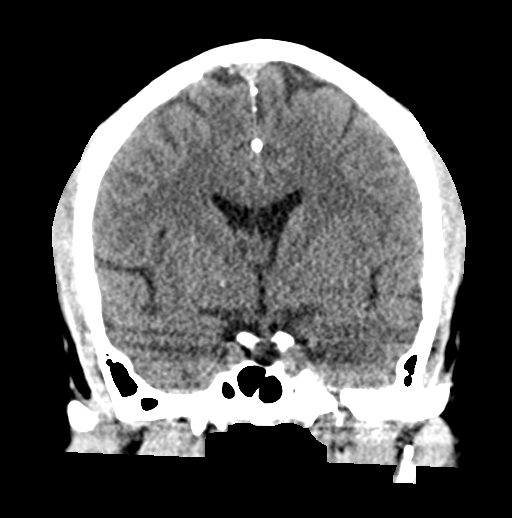

[Series 6: sagittal soft tissue · sagittal · 0.34mm/px · 3 of 62 slices shown]
[im 21/62  brain]
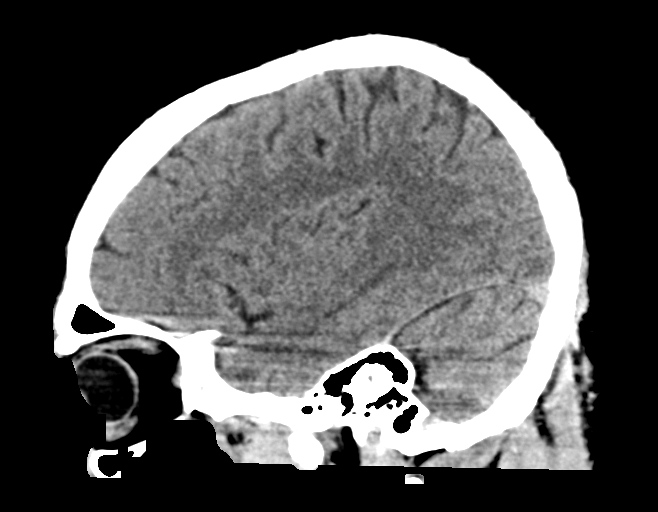
[im 31/62  brain]
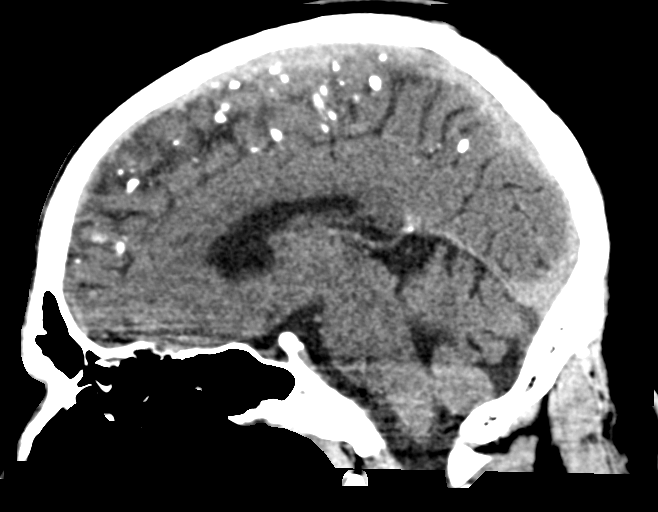
[im 41/62  brain]
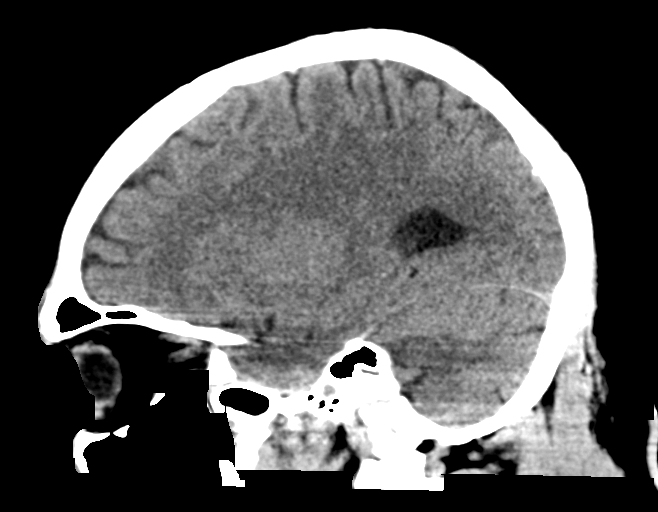

[16 of 47 positions shown; findings below may reference images not displayed]

FINDINGS: Brain: Generalized age-related cerebral atrophy. No acute
intracranial hemorrhage. No acute large vessel territory infarct. No
mass lesion, midline shift or mass effect. No hydrocephalus or
extra-axial fluid collection.

Vascular: No hyperdense vessel.

Skull: Scalp soft tissues within normal limits.  Calvarium intact.

Sinuses/Orbits: Globes and orbital soft tissues demonstrate no acute
finding. Acute mandibular fractures partially visualize, better
evaluated on concomitant maxillofacial CT. Visualized paranasal
sinuses are clear. No mastoid effusion.

Other: None.
IMPRESSION: 1. No acute intracranial abnormality.
2. Acute mandibular fractures, better evaluated on concomitant
maxillofacial CT.
